# Patient Record
Sex: Male | Born: 1975 | Race: Black or African American | Hispanic: No | Marital: Married | Smoking: Former smoker
Health system: Southern US, Community
[De-identification: ages and names within clinical notes are randomized; demographics above are authoritative.]

## PROBLEM LIST (undated history)

## (undated) DIAGNOSIS — I10 Essential (primary) hypertension: Secondary | ICD-10-CM

## (undated) HISTORY — PX: OTHER SURGICAL HISTORY: SHX169

## (undated) HISTORY — DX: Essential (primary) hypertension: I10

---

## 2004-07-08 ENCOUNTER — Emergency Department (HOSPITAL_COMMUNITY): Admission: EM | Admit: 2004-07-08 | Discharge: 2004-07-08 | Payer: Self-pay | Admitting: Emergency Medicine

## 2006-05-29 ENCOUNTER — Emergency Department (HOSPITAL_COMMUNITY): Admission: EM | Admit: 2006-05-29 | Discharge: 2006-05-29 | Payer: Self-pay | Admitting: Emergency Medicine

## 2006-11-17 ENCOUNTER — Emergency Department (HOSPITAL_COMMUNITY): Admission: EM | Admit: 2006-11-17 | Discharge: 2006-11-17 | Payer: Self-pay | Admitting: Emergency Medicine

## 2008-10-14 ENCOUNTER — Emergency Department (HOSPITAL_COMMUNITY): Admission: EM | Admit: 2008-10-14 | Discharge: 2008-10-14 | Payer: Self-pay | Admitting: *Deleted

## 2010-05-28 ENCOUNTER — Emergency Department (HOSPITAL_COMMUNITY)
Admission: EM | Admit: 2010-05-28 | Discharge: 2010-05-28 | Payer: Self-pay | Source: Home / Self Care | Admitting: Emergency Medicine

## 2010-11-12 LAB — URINALYSIS, ROUTINE W REFLEX MICROSCOPIC
Nitrite: NEGATIVE
Specific Gravity, Urine: 1.031 — ABNORMAL HIGH (ref 1.005–1.030)
pH: 5.5 (ref 5.0–8.0)

## 2012-04-20 ENCOUNTER — Encounter (HOSPITAL_COMMUNITY): Payer: Self-pay | Admitting: Emergency Medicine

## 2012-04-20 ENCOUNTER — Emergency Department (HOSPITAL_COMMUNITY)
Admission: EM | Admit: 2012-04-20 | Discharge: 2012-04-20 | Discharge status: Against Medical Advice | Payer: Self-pay | Attending: Emergency Medicine | Admitting: Emergency Medicine

## 2012-04-20 DIAGNOSIS — R300 Dysuria: Secondary | ICD-10-CM | POA: Insufficient documentation

## 2012-04-20 DIAGNOSIS — R109 Unspecified abdominal pain: Secondary | ICD-10-CM | POA: Insufficient documentation

## 2012-04-20 LAB — CBC WITH DIFFERENTIAL/PLATELET
Basophils Absolute: 0 10*3/uL (ref 0.0–0.1)
Eosinophils Absolute: 0 10*3/uL (ref 0.0–0.7)
Lymphocytes Relative: 37 % (ref 12–46)
Lymphs Abs: 1.7 10*3/uL (ref 0.7–4.0)
Neutrophils Relative %: 56 % (ref 43–77)
Platelets: 205 10*3/uL (ref 150–400)
RBC: 5.01 MIL/uL (ref 4.22–5.81)
RDW: 12.8 % (ref 11.5–15.5)
WBC: 4.7 10*3/uL (ref 4.0–10.5)

## 2012-04-20 LAB — URINALYSIS, ROUTINE W REFLEX MICROSCOPIC
Nitrite: NEGATIVE
Specific Gravity, Urine: 1.024 (ref 1.005–1.030)
Urobilinogen, UA: 0.2 mg/dL (ref 0.0–1.0)

## 2012-04-20 LAB — POCT I-STAT, CHEM 8
BUN: 18 mg/dL (ref 6–23)
Creatinine, Ser: 1.4 mg/dL — ABNORMAL HIGH (ref 0.50–1.35)
Hemoglobin: 16.7 g/dL (ref 13.0–17.0)
Potassium: 4 mEq/L (ref 3.5–5.1)
Sodium: 141 mEq/L (ref 135–145)

## 2012-04-20 NOTE — ED Notes (Signed)
Reports 2 weeks ago, had dysuria, having L flank pain; pt reports urinary frequency; has tried drinking water and cranberry pills with no relief

## 2012-04-20 NOTE — ED Notes (Signed)
Pt reports he wants to go home d/t having to work and is unable to stay; advised pt of risks of leaving and benefits; pt wants to still leave

## 2012-04-22 ENCOUNTER — Encounter (HOSPITAL_COMMUNITY): Payer: Self-pay | Admitting: *Deleted

## 2012-04-22 ENCOUNTER — Emergency Department (HOSPITAL_COMMUNITY)
Admission: EM | Admit: 2012-04-22 | Discharge: 2012-04-22 | Disposition: A | Discharge status: Home / Self Care | Payer: Self-pay | Attending: Emergency Medicine | Admitting: Emergency Medicine

## 2012-04-22 DIAGNOSIS — R1012 Left upper quadrant pain: Secondary | ICD-10-CM | POA: Insufficient documentation

## 2012-04-22 DIAGNOSIS — R030 Elevated blood-pressure reading, without diagnosis of hypertension: Secondary | ICD-10-CM | POA: Insufficient documentation

## 2012-04-22 DIAGNOSIS — Z87891 Personal history of nicotine dependence: Secondary | ICD-10-CM | POA: Insufficient documentation

## 2012-04-22 DIAGNOSIS — R109 Unspecified abdominal pain: Secondary | ICD-10-CM

## 2012-04-22 LAB — POCT I-STAT, CHEM 8
Creatinine, Ser: 1.2 mg/dL (ref 0.50–1.35)
HCT: 46 % (ref 39.0–52.0)
Hemoglobin: 15.6 g/dL (ref 13.0–17.0)
Potassium: 3.8 mEq/L (ref 3.5–5.1)
Sodium: 141 mEq/L (ref 135–145)
TCO2: 25 mmol/L (ref 0–100)

## 2012-04-22 LAB — URINALYSIS, ROUTINE W REFLEX MICROSCOPIC
Hgb urine dipstick: NEGATIVE
Leukocytes, UA: NEGATIVE
Nitrite: NEGATIVE
Protein, ur: NEGATIVE mg/dL
Specific Gravity, Urine: 1.023 (ref 1.005–1.030)
Urobilinogen, UA: 0.2 mg/dL (ref 0.0–1.0)

## 2012-04-22 MED ORDER — IBUPROFEN 800 MG PO TABS: 800.0000 mg | ORAL_TABLET | Freq: Three times a day (TID) | ORAL | Status: AC

## 2012-04-22 NOTE — ED Notes (Signed)
Pt c/o increasing flank pain and dysuria x 2 months.  Denies n/v, SOB, CP

## 2012-04-22 NOTE — ED Provider Notes (Signed)
Medical screening examination/treatment/procedure(s) were performed by non-physician practitioner and as supervising physician I was immediately available for consultation/collaboration.   Loren Racer, MD 04/22/12 [phone]

## 2012-04-22 NOTE — ED Provider Notes (Signed)
History     CSN: [id number]  Arrival date & time 04/22/12  0403   First MD Initiated Contact with Patient 04/22/12 [phone]      Chief Complaint  Patient presents with  . Flank Pain    (Consider location/radiation/quality/duration/timing/severity/associated sxs/prior treatment) Patient is a 36 y.o. male presenting with flank pain. The history is provided by the patient.  Flank Pain This is a new problem. The current episode started more than 1 month ago. Associated symptoms include abdominal pain. Pertinent negatives include no chest pain, chills, coughing, fever, nausea or vomiting. Associated symptoms comments: Left flank pain x 2 months, initially associated with dysuria. Shortly after onset of symptoms, dysuria stopped but flank pain continued. One month ago, pain radiated into LUQ abdomen. No N, V, fever at any time. No hematuria. He denies penile discharge, testicular pain or scrotal swelling. He was concerned as he was lifting a lot of weights in the gym and taking supplements, but cannot report the contents of the supplements. He stopped both working out and taking supplements over one week ago without change. The pain now is worse with movement, better with rest. No pleuritic pain, cough or SOB. Marland Kitchen    History reviewed. No pertinent past medical history.  History reviewed. No pertinent past surgical history.  History reviewed. No pertinent family history.  History  Substance Use Topics  . Smoking status: Former Games developer  . Smokeless tobacco: Not on file   Comment: black and milds--quit 15 years ago  . Alcohol Use: Yes     occasion      Review of Systems  Constitutional: Negative for fever, chills and unexpected weight change.  Respiratory: Negative for cough and shortness of breath.   Cardiovascular: Negative for chest pain.  Gastrointestinal: Positive for abdominal pain. Negative for nausea, vomiting and diarrhea.  Genitourinary: Positive for dysuria and flank pain.  Negative for discharge, scrotal swelling and testicular pain.    Allergies  Review of patient's allergies indicates no known allergies.  Home Medications  No current outpatient prescriptions on file.  BP 144/91  Pulse 63  Temp 97.3 F (36.3 C) (Oral)  Resp 16  SpO2 98%  Physical Exam  Constitutional: He is oriented to person, place, and time. He appears well-developed and well-nourished.  Neck: Normal range of motion.  Pulmonary/Chest: Effort normal.  Abdominal: Soft. Bowel sounds are normal. There is no rebound and no guarding.       Mild tenderness to palpation LUQ and left side abdomen. No mass. Abdomen soft.   Genitourinary:       No CVA tenderness.  Musculoskeletal: Normal range of motion.  Neurological: He is alert and oriented to person, place, and time.  Skin: Skin is warm and dry. No rash noted.  Psychiatric: He has a normal mood and affect.    ED Course  Procedures (including critical care time)  Labs Reviewed  POCT I-STAT, CHEM 8 - Abnormal; Notable for the following:    Calcium, Ion 1.28 (*)     All other components within normal limits  URINALYSIS, ROUTINE W REFLEX MICROSCOPIC   Results for orders placed during the hospital encounter of 04/22/12  URINALYSIS, ROUTINE W REFLEX MICROSCOPIC      Component Value Range   Color, Urine YELLOW  YELLOW   APPearance CLEAR  CLEAR   Specific Gravity, Urine 1.023  1.005 - 1.030   pH 6.0  5.0 - 8.0   Glucose, UA NEGATIVE  NEGATIVE mg/dL   Hgb urine dipstick  NEGATIVE  NEGATIVE   Bilirubin Urine NEGATIVE  NEGATIVE   Ketones, ur NEGATIVE  NEGATIVE mg/dL   Protein, ur NEGATIVE  NEGATIVE mg/dL   Urobilinogen, UA 0.2  0.0 - 1.0 mg/dL   Nitrite NEGATIVE  NEGATIVE   Leukocytes, UA NEGATIVE  NEGATIVE  POCT I-STAT, CHEM 8      Component Value Range   Sodium 141  135 - 145 mEq/L   Potassium 3.8  3.5 - 5.1 mEq/L   Chloride 103  96 - 112 mEq/L   BUN 18  6 - 23 mg/dL   Creatinine, Ser 0.98  0.50 - 1.35 mg/dL   Glucose,  Bld 98  70 - 99 mg/dL   Calcium, Ion 1.19 (*) 1.12 - 1.23 mmol/L   TCO2 25  0 - 100 mmol/L   Hemoglobin 15.6  13.0 - 17.0 g/dL   HCT 14.7  82.9 - 56.2 %    No results found.   No diagnosis found. 1. Abdominal wall pain    MDM  Long duration of symptoms, heavy weight lifting and reproducible tenderness support muscular basis of pain. BUN, Cr. Evaluated given unknown supplements and mildly elevated blood pressure. Normal. Will discharge home, refer to PCP if persistent.        Rodena Medin, PA-C 04/22/12 [phone]

## 2012-09-28 ENCOUNTER — Emergency Department (HOSPITAL_COMMUNITY)
Admission: EM | Admit: 2012-09-28 | Discharge: 2012-09-29 | Disposition: A | Discharge status: Home / Self Care | Payer: Self-pay | Attending: Emergency Medicine | Admitting: Emergency Medicine

## 2012-09-28 DIAGNOSIS — K029 Dental caries, unspecified: Secondary | ICD-10-CM | POA: Insufficient documentation

## 2012-09-28 DIAGNOSIS — K047 Periapical abscess without sinus: Secondary | ICD-10-CM

## 2012-09-28 DIAGNOSIS — Z87891 Personal history of nicotine dependence: Secondary | ICD-10-CM | POA: Insufficient documentation

## 2012-09-28 NOTE — ED Notes (Signed)
Lt. Upper jaw is swollen, facial swelling. Third tooth from the back is painful r/t swelling.

## 2012-09-29 MED ORDER — OXYCODONE-ACETAMINOPHEN 5-325 MG PO TABS: 1.0000 | ORAL_TABLET | Freq: Four times a day (QID) | ORAL | Status: DC | PRN

## 2012-09-29 MED ORDER — CLINDAMYCIN HCL 300 MG PO CAPS: 300.0000 mg | ORAL_CAPSULE | Freq: Four times a day (QID) | ORAL | Status: DC

## 2012-09-29 MED ORDER — CLINDAMYCIN HCL 150 MG PO CAPS
300.0000 mg | ORAL_CAPSULE | Freq: Once | ORAL | Status: AC
  Administered 2012-09-29: 300 mg via ORAL
  Filled 2012-09-29: qty 2

## 2012-09-29 MED ORDER — OXYCODONE-ACETAMINOPHEN 5-325 MG PO TABS
2.0000 | ORAL_TABLET | Freq: Once | ORAL | Status: AC
Start: 2012-09-29 — End: 2012-09-29
  Administered 2012-09-29: 2 via ORAL
  Filled 2012-09-29: qty 2

## 2012-09-29 NOTE — ED Notes (Signed)
MD at bedside. 

## 2012-09-29 NOTE — ED Provider Notes (Signed)
History     CSN: [id number]  Arrival date & time 09/28/12  2319   First MD Initiated Contact with Patient 09/29/12 0007      Chief Complaint  Patient presents with  . Dental Pain   HPI  History provided by the patient. Patient is 37 year old male with no significant past medical history presents with complaints of left upper dental pain and facial swelling. Patient first began to have some slight swelling of the face yesterday and woke up this morning with significantly increased swelling of the face and pain. Patient has had a long history of a bad tooth in this area which occasionally causes some pain in and irritation. This was worsened over the past several days. He denies any other associated symptoms. Denies any fever, chills or sweats. Denies any bleeding or drainage. He denies any other aggravating or alleviating factors.     No past medical history on file.  No past surgical history on file.  No family history on file.  History  Substance Use Topics  . Smoking status: Former Games developer  . Smokeless tobacco: Not on file     Comment: black and milds--quit 15 years ago  . Alcohol Use: Yes     Comment: occasion      Review of Systems  Constitutional: Negative for fever and chills.  HENT: Positive for dental problem. Negative for sore throat and trouble swallowing.   Gastrointestinal: Negative for nausea and vomiting.  All other systems reviewed and are negative.    Allergies  Review of patient's allergies indicates no known allergies.  Home Medications   Current Outpatient Rx  Name  Route  Sig  Dispense  Refill  . IBUPROFEN 200 MG PO TABS   Oral   Take 200 mg by mouth every 6 (six) hours as needed. For pain           BP 174/90  Pulse 81  Temp 97.9 F (36.6 C) (Oral)  Resp 16  SpO2 100%  Physical Exam  Nursing note and vitals reviewed. Constitutional: He is oriented to person, place, and time. He appears well-developed and well-nourished.  HENT:    Head: Normocephalic.  Mouth/Throat:         Swelling and loss of the left nasolabial fold.  Skin otherwise normal without erythema or induration.  There is erosion of the left upper 2nd premolar to the gum line. There is swelling with fluctuance to the lateral and adjacent gum with TTP. Significant dental cary to the left upper first molar.    Neck: Normal range of motion. Neck supple.  Cardiovascular: Normal rate and regular rhythm.   Pulmonary/Chest: Effort normal and breath sounds normal.  Lymphadenopathy:    He has no cervical adenopathy.  Neurological: He is alert and oriented to person, place, and time.  Skin: Skin is warm.  Psychiatric: He has a normal mood and affect.    ED Course  Procedures   INCISION AND DRAINAGE Performed by: Angus Seller Consent: Verbal consent obtained. Risks and benefits: risks, benefits and alternatives were discussed Type: dental abscess  Body area: left upper 2nd premolar  Anesthesia: local infiltration  Incision was made with a scalpel.  Local anesthetic: Bupivacaine 0.5% with epinephrine  Anesthetic total: 1.8 ml  Complexity: complex  Drainage: purulent  Drainage amount: moderate  Packing material: none  Patient tolerance: Patient tolerated the procedure well with no immediate complications.       1. Dental abscess       MDM  12:00AM Pt seen and evaluated.  Pt does not appear in any acute distress.        Angus Seller, [place] 09/29/12 [phone]

## 2012-09-29 NOTE — ED Provider Notes (Signed)
Medical screening examination/treatment/procedure(s) were performed by non-physician practitioner and as supervising physician I was immediately available for consultation/collaboration.    Vida Roller, MD 09/29/12 [phone]

## 2014-05-02 ENCOUNTER — Ambulatory Visit
Admission: RE | Admit: 2014-05-02 | Discharge: 2014-05-02 | Disposition: A | Discharge status: Home / Self Care | Payer: 59 | Source: Ambulatory Visit | Attending: Family Medicine | Admitting: Family Medicine

## 2014-05-02 ENCOUNTER — Other Ambulatory Visit: Payer: Self-pay | Admitting: Family Medicine

## 2014-05-02 DIAGNOSIS — R300 Dysuria: Secondary | ICD-10-CM

## 2016-03-24 ENCOUNTER — Other Ambulatory Visit: Payer: Self-pay | Admitting: Family Medicine

## 2016-03-24 ENCOUNTER — Ambulatory Visit
Admission: RE | Admit: 2016-03-24 | Discharge: 2016-03-24 | Disposition: A | Discharge status: Home / Self Care | Payer: BLUE CROSS/BLUE SHIELD | Source: Ambulatory Visit | Attending: Family Medicine | Admitting: Family Medicine

## 2016-03-24 DIAGNOSIS — M25572 Pain in left ankle and joints of left foot: Secondary | ICD-10-CM

## 2016-03-24 DIAGNOSIS — M25562 Pain in left knee: Secondary | ICD-10-CM

## 2016-04-19 ENCOUNTER — Encounter: Payer: Self-pay | Admitting: Surgery

## 2016-04-21 ENCOUNTER — Encounter: Payer: Self-pay | Admitting: Surgery

## 2016-04-21 ENCOUNTER — Other Ambulatory Visit: Payer: Self-pay | Admitting: *Deleted

## 2016-04-21 ENCOUNTER — Ambulatory Visit (HOSPITAL_COMMUNITY)
Admission: RE | Admit: 2016-04-21 | Discharge: 2016-04-21 | Disposition: A | Discharge status: Home / Self Care | Payer: Worker's Compensation | Source: Ambulatory Visit | Attending: Surgery | Admitting: Surgery

## 2016-04-21 ENCOUNTER — Ambulatory Visit (INDEPENDENT_AMBULATORY_CARE_PROVIDER_SITE_OTHER): Payer: BLUE CROSS/BLUE SHIELD | Admitting: Surgery

## 2016-04-21 VITALS — BP 145/96 | HR 85 | Temp 97.3°F | Resp 18 | Ht 78.0 in | Wt 276.0 lb

## 2016-04-21 DIAGNOSIS — I10 Essential (primary) hypertension: Secondary | ICD-10-CM

## 2016-04-21 DIAGNOSIS — T794XXA Traumatic shock, initial encounter: Secondary | ICD-10-CM | POA: Diagnosis not present

## 2016-04-21 DIAGNOSIS — R0989 Other specified symptoms and signs involving the circulatory and respiratory systems: Secondary | ICD-10-CM | POA: Diagnosis present

## 2016-04-21 DIAGNOSIS — I739 Peripheral vascular disease, unspecified: Secondary | ICD-10-CM | POA: Insufficient documentation

## 2016-04-21 LAB — VAS US LOWER EXTREMITY ARTERIAL DUPLEX
LATIBDISTSYS: 56 cm/s
LPTIBDISTSYS: 85 cm/s
LSFDPSV: -71 cm/s
LSFPPSV: 71 cm/s
Left popliteal dist sys PSV: -72 cm/s
Left popliteal prox sys PSV: 52 cm/s
Left super femoral mid sys PSV: -76 cm/s
Right super femoral prox sys PSV: 80 cm/s
left post tibial mid sys: 99 cm/s

## 2016-04-21 NOTE — Progress Notes (Signed)
Vitals:   04/21/16 1046  BP: (!) 152/95  Pulse: 85  Resp: 18  Temp: 97.3 F (36.3 C)  SpO2: 99%  Weight: 276 lb (125.2 kg)  Height: 6\' 6"  (1.981 m)

## 2016-04-21 NOTE — Progress Notes (Signed)
[facility] [place]  Patient name: Donald Stafford L Orchard MRN: [id number] DOB: [birth year] Sex: male  REFERRING PHYSICIAN: Dr. Berline Choughigby  REASON FOR CONSULT: ? Arterial trauma  HPI: Donald Stafford L Knee is a 40 y.o. male, who is who is referred today for evaluation of arterial injury to his left leg.  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  He describes a slow compressive injury causing him significant pain and swelling along the inside of his left knee and ankle.  This was apparent at the time of injury but did not cause him to severe discomfort until the following day.  He has had approximately 3 separate episodes of having to have the fluid in his knee aspirated.  He complains of some numbness around the inside of his left ankle.  While at Dr. Janeece Riggersigby's office, the patient had a musculoskeletal ultrasound performed during the study there was concern for an AV fistula as well as possible aneurysmal dilatation.  Therefore he was sent today for formal vascular evaluation.  The patient's only pertinent medical history is hypertension.  Past Medical History:  Diagnosis Date  . Hypertension     Family History  Problem Relation Age of Onset  . Hypertension Mother     SOCIAL HISTORY: Social History   Social History  . Marital status: Married    Spouse name: N/A  . Number of children: N/A  . Years of education: N/A   Occupational History  . Not on file.   Social History Main Topics  . Smoking status: Former Smoker    Types: Cigars    Quit date: 10/23/2015  . Smokeless tobacco: Never Used     Comment: black and milds--quit 15 years ago  . Alcohol use Yes     Comment: occasion  . Drug use: No  . Sexual activity: Not on file   Other Topics Concern  . Not on file   Social History Narrative  . No narrative on file    No Known Allergies  Current Outpatient Prescriptions  Medication Sig  Dispense Refill  . hydrochlorothiazide (HYDRODIURIL) 12.5 MG tablet TK 1 T PO QD  12  . oxyCODONE-acetaminophen (PERCOCET/ROXICET) 5-325 MG per tablet Take 1-2 tablets by mouth every 6 (six) hours as needed for pain. 20 tablet 0  . clindamycin (CLEOCIN) 300 MG capsule Take 1 capsule (300 mg total) by mouth 4 (four) times daily. X 7 days (Patient not taking: Reported on 04/21/2016) 28 capsule 0  . ibuprofen (ADVIL,MOTRIN) 200 MG tablet Take 200 mg by mouth every 6 (six) hours as needed. For pain     No current facility-administered medications for this visit.     REVIEW OF SYSTEMS:  [X]  denotes positive finding, [ ]  denotes negative finding Cardiac  Comments:  Chest pain or chest pressure:    Shortness of breath upon exertion:    Short of breath when lying flat:    Irregular heart rhythm:        Vascular    Pain in calf, thigh, or hip brought on by ambulation:    Pain in feet at night that wakes you up from your sleep:     Blood clot in your veins:    Leg swelling:         Pulmonary    Oxygen at home:    Productive cough:     Wheezing:         Neurologic  Sudden weakness in arms or legs:  Numbness around left ankle   Sudden numbness in arms or legs:     Sudden onset of difficulty speaking or slurred speech:    Temporary loss of vision in one eye:     Problems with dizziness:         Gastrointestinal    Blood in stool:     Vomited blood:         Genitourinary    Burning when urinating:     Blood in urine:        Psychiatric    Major depression:         Hematologic    Bleeding problems:    Problems with blood clotting too easily:        Skin    Rashes or ulcers:        Constitutional    Fever or chills:      PHYSICAL EXAM: Vitals:   04/21/16 1046 04/21/16 1050  BP: (!) 152/95 (!) 145/96  Pulse: 85 85  Resp: 18   Temp: 97.3 F (36.3 C)   SpO2: 99%   Weight: 276 lb (125.2 kg)   Height: 6\' 6"  (1.981 m)     GENERAL: The patient is a well-nourished male,  in no acute distress. The vital signs are documented above. CARDIAC: There is a regular rate and rhythm.  VASCULAR: Palpable dorsalis pedis pulse.  Brisk Doppler signals within the posterior tibial artery at the ankle  PULMONARY: There is good air exchange bilaterally without wheezing or rales. MUSCULOSKELETAL:Prominent appearance of the medial side of the left knee  NEUROLOGIC: No focal weakness or paresthesias are detected. SKIN: There are no ulcers or rashes noted. PSYCHIATRIC: The patient has a normal affect.  DATA:  I had the left leg ultrasounded today given the findings on the outside ultrasound.  No AV fistula was identified.  No aneurysm was identified.  The arterial waveforms all appeared normal  ASSESSMENT AND PLAN: Based on the ultrasound findings today, I do not think that the patient has a arterial or venous pathology.  I have discussed this with the patient.  It is unclear to me what was visualized on the musculoskeletal ultrasound.  No further vascular workup is recommended at this time.  Durene CalWells Doneen Ollinger, MD [facility] Tel [phone] Pager [phone]

## 2016-05-25 ENCOUNTER — Other Ambulatory Visit: Payer: Self-pay | Admitting: Family Medicine

## 2016-05-25 DIAGNOSIS — R079 Chest pain, unspecified: Secondary | ICD-10-CM

## 2016-05-27 ENCOUNTER — Other Ambulatory Visit: Payer: Self-pay

## 2016-05-31 ENCOUNTER — Other Ambulatory Visit: Payer: Self-pay | Admitting: Family Medicine

## 2016-05-31 ENCOUNTER — Ambulatory Visit
Admission: RE | Admit: 2016-05-31 | Discharge: 2016-05-31 | Disposition: A | Discharge status: Home / Self Care | Payer: BLUE CROSS/BLUE SHIELD | Source: Ambulatory Visit | Attending: Family Medicine | Admitting: Family Medicine

## 2016-05-31 DIAGNOSIS — R0789 Other chest pain: Secondary | ICD-10-CM

## 2016-06-17 ENCOUNTER — Other Ambulatory Visit: Payer: Self-pay | Admitting: Family Medicine

## 2016-06-17 DIAGNOSIS — R109 Unspecified abdominal pain: Secondary | ICD-10-CM

## 2016-06-18 ENCOUNTER — Ambulatory Visit
Admission: RE | Admit: 2016-06-18 | Discharge: 2016-06-18 | Disposition: A | Discharge status: Home / Self Care | Payer: BLUE CROSS/BLUE SHIELD | Source: Ambulatory Visit | Attending: Family Medicine | Admitting: Family Medicine

## 2016-06-18 DIAGNOSIS — R109 Unspecified abdominal pain: Secondary | ICD-10-CM

## 2016-06-18 MED ORDER — IOPAMIDOL (ISOVUE-300) INJECTION 61%
125.0000 mL | Freq: Once | INTRAVENOUS | Status: AC | PRN
Start: 2016-06-18 — End: 2016-06-18
  Administered 2016-06-18: 125 mL via INTRAVENOUS

## 2016-07-15 ENCOUNTER — Ambulatory Visit (INDEPENDENT_AMBULATORY_CARE_PROVIDER_SITE_OTHER): Payer: Worker's Compensation | Admitting: Sports Medicine

## 2016-07-15 ENCOUNTER — Inpatient Hospital Stay (INDEPENDENT_AMBULATORY_CARE_PROVIDER_SITE_OTHER): Payer: Worker's Compensation

## 2016-07-15 ENCOUNTER — Encounter (INDEPENDENT_AMBULATORY_CARE_PROVIDER_SITE_OTHER): Payer: Self-pay | Admitting: Sports Medicine

## 2016-07-15 VITALS — BP 134/92 | HR 83 | Ht 78.0 in | Wt 280.0 lb

## 2016-07-15 DIAGNOSIS — M722 Plantar fascial fibromatosis: Secondary | ICD-10-CM | POA: Diagnosis not present

## 2016-07-15 DIAGNOSIS — M79672 Pain in left foot: Secondary | ICD-10-CM | POA: Diagnosis not present

## 2016-07-15 DIAGNOSIS — S8002XD Contusion of left knee, subsequent encounter: Secondary | ICD-10-CM | POA: Diagnosis not present

## 2016-07-15 DIAGNOSIS — S9002XD Contusion of left ankle, subsequent encounter: Secondary | ICD-10-CM | POA: Diagnosis not present

## 2016-07-15 MED ORDER — DICLOFENAC SODIUM 1 % TD GEL: TRANSDERMAL | 2 refills | Status: DC

## 2016-07-15 NOTE — Progress Notes (Signed)
Donald Stafford - 40 y.o. male MRN [id number]  Date of birth: 07/23/1976  Office Visit Note: Visit Date: 07/15/2016 PCP: [name], MD Referred by: [name], L.[name], MD  Subjective: Chief Complaint  Patient presents with  . Left Knee - Follow-up  . Left Ankle - Follow-up  . Follow-up    Patient states going good. States left foot and left knee has been hurting for about a week.  Had some swellling in left foot when getting off from work in the mornings.     HPI: Patient presents for reevaluation of left ankle & knee pain. He overall has been doing well & has been able to resume activities at work without significant issues. He has noticed some worsening fluctuance,  discomfort & snapping along the medial aspect of his knee. He localizes to the area of the prior hematoma. Additionally his left ankle has continued to have discomfort along the medial aspect & he does report severe pain upon first awakening along the plantar aspect of his heel. He is no longer taking any medications but is continuing to use the compression sleeves especially while work. He is not been performing any specific therapeutic exercises. Prior to 1 week ago he was feeling well & reports only minimal symptoms however with the most recent weather change she has had a flareup of the discomfort. ROS: Otherwise per HPI.   Clinical History: No specialty comments available.  He reports that he quit smoking about 8 months ago. His smoking use included Cigars. He has never used smokeless tobacco.  No results for input(s): HGBA1C, LABURIC in the last 8760 hours.  Assessment & Plan: Visit Diagnoses:  1. Pain in left foot   2. Plantar fasciitis of left foot   3. Contusion of left ankle, subsequent encounter   4. Contusion of left knee, subsequent encounter    Plan: Given findings today on [facility] ultrasound physical exam on like for him to begin on Alfredson protocol for his plantar fasciitis as well as obtain cushioned  orthotics/insoles for her shoes. Additionally for the persistent discomfort along the medial ankle & knee which seems to reflect a resolving hematoma he is to begin using topical Voltaren gel. He should continue with compression.  Follow-up: Return in about 5 weeks (around 08/19/2016) for repeat clinical exam. Will hopefully be to release him at that time as long as he continues to do well.  Meds:  Meds ordered this encounter  Medications  . diclofenac sodium (VOLTAREN) 1 % GEL    Sig: Apply topically to medial knee and medial ankle qid    Dispense:  100 g    Refill:  2   Procedures: No notes on file   Objective:  VS:  HT:6\' 6"  (198.1 cm)   WT:280 lb (127 kg)  BMI:32.4    BP:(!) 134/92  HR:83bpm  TEMP: ( )  RESP:  Physical Exam: Adult male. No acute distress. Alert & appropriate.  Left knee: Small area of fluctuance along the medial knee localizes superior to the joint line within the soft tissue. This is nonpainful but does have a small amount of fluctuance & this reproduces symptoms he reports as being bothersome. Negative McMurray's. Hehas full range of motion. Anterior/ posterior drawer intact. Stable to varus & valgus strain. No medial or lateral joint line pain. No knee effusion. Left ankle: Some persistent pain along the distal posterior tibialis tendon. He has good strength. Dorsiflexion to 90 passively with an extended knee. He has marked pain  along the insertion of the plantar fascia. Slightly positive Tinel's just distal to the medial malleolus.  Imaging: [employer] Extrem Low Left [employer]  Result Date: 07/16/2016 Limited [facility] ultrasound of left ankle: Images obtained today and interpreted by myself. Findings:  Posterior tibialis tendon visualized in its entirety with overall intact tendon fibers with moderate thickening of the tendon sheath with neovascular changes superficial to this within the soft tissue structures.  Plantar fascia was measured today at approximately 0.6 cm. Impression:  Improving posterior tibialis tenosynovitis in the setting of underlying ankle contusion with moderate plantar fasciitis.    Past Medical/Family/Surgical/Social History: Medications & Allergies reviewed per EMR There are no active problems to display for this patient.  Past Medical History:  Diagnosis Date  . Hypertension    Family History  Problem Relation Age of Onset  . Hypertension Mother    Past Surgical History:  Procedure Laterality Date  . arm surgery Right    Social History   Occupational History  . Not on file.   Social History Main Topics  . Smoking status: Former Smoker    Types: Cigars    Quit date: 10/23/2015  . Smokeless tobacco: Never Used     Comment: black and milds--quit 15 years ago  . Alcohol use Yes     Comment: occasion  . Drug use: No  . Sexual activity: Not on file

## 2016-08-16 ENCOUNTER — Ambulatory Visit (INDEPENDENT_AMBULATORY_CARE_PROVIDER_SITE_OTHER): Payer: Worker's Compensation | Admitting: Sports Medicine

## 2016-08-16 ENCOUNTER — Encounter (INDEPENDENT_AMBULATORY_CARE_PROVIDER_SITE_OTHER): Payer: Self-pay

## 2016-08-16 ENCOUNTER — Encounter (INDEPENDENT_AMBULATORY_CARE_PROVIDER_SITE_OTHER): Payer: Self-pay | Admitting: Sports Medicine

## 2016-08-16 VITALS — BP 150/104 | HR 83 | Ht 78.0 in | Wt 285.0 lb

## 2016-08-16 DIAGNOSIS — M79672 Pain in left foot: Secondary | ICD-10-CM

## 2016-08-16 DIAGNOSIS — S9002XD Contusion of left ankle, subsequent encounter: Secondary | ICD-10-CM | POA: Diagnosis not present

## 2016-08-16 DIAGNOSIS — M722 Plantar fascial fibromatosis: Secondary | ICD-10-CM

## 2016-08-16 DIAGNOSIS — S8002XD Contusion of left knee, subsequent encounter: Secondary | ICD-10-CM

## 2016-08-16 NOTE — Patient Instructions (Signed)
It was great seen today.  It has been a pleasure to take care of you.  I will have you follow-up in 3 months with Dr. [name] since I'm changing practices.  Continue performing the Alfredson exercises I provided at last visit on a 2-3 time per day basis.  Is okay to continue with the diclofenac gel 2-4 times per day as needed.  Ice your heel as needed as well

## 2016-08-16 NOTE — Progress Notes (Signed)
Donald Stafford - 40 y.o. male MRN [id number]  Date of birth: [birth year]  Office Visit Note: Visit Date: 08/16/2016 PCP: Donald Carneyean Mitchell, MD Referred by: Donald Stafford, L.Donald Saucerean, MD  Subjective: Chief Complaint  Patient presents with  . Left Foot - Follow-up  . Follow-up    Patient states left foot is getting better, but hurts when getting up in the mornings.  Hurts after being on left foot for to long.   HPI: Patient reports overall continuing to improve and having essentially no medial ankle or medial knee pain.  He is continuing to have pain along the plantar aspect of his heel especially upon first awakening, towards the end of the day and after prolonged rest.  He has been working on [employer] exercises and continues to wear the ankle bodies helix.  He has used Voltaren gel 1-2 times daily with moderate improvement over the medial ankle.  He does continue to have some swelling over the medial ankle but has not noted any significant pain with inversion and eversion motions of the ankle. ROS: He is not having to take any medications at this time other topical Voltaren.  SOLE insoles were not approved through worker's compensation.. Otherwise per HPI.   Clinical History: No specialty comments available.  He reports that he quit smoking about 9 months ago. His smoking use included Cigars. He has never used smokeless tobacco.  No results for input(s): HGBA1C, LABURIC in the last 8760 hours.  Assessment & Plan: Visit Diagnoses:    ICD-9-CM ICD-10-CM   1. Pain in left foot 729.5 M79.672   2. Contusion of left ankle, subsequent encounter V58.89 S90.02XD   3. Contusion of left knee, subsequent encounter V58.89 S80.02XD    924.11    4. Plantar fasciitis of left foot 728.71 M72.2     Plan: Overall the contusion related pain in the knee and ankle have shown remarkable improvement and are almost totally resolved.This was stemming from a crush injury on July 14 and he became entrapped between a  palate jack.  Developed a large hematoma along the medial aspect of the knee that underwent serial aspirations by his PCP and subsequently developed swelling in tenosynovitis of the medial ankle.  He was having some posterior tibialis tenosynovitis that I believe has also improved although he does have some residual swelling.  He should continue to show good improvement with this and I would like for him to continue wearing a Body Helix compression sleeve and continue with the Alfredson exercises.  I have provided a waffle heel cup to be added to shoes for additional heel support.  The plantar fasciitis-type pain that he is having his directly related to the prior ankle and knee issues from the injury sustained earlier in the year.  This is more of a compensatory pattern following the immobilization and favoring of his leg. We discussed that he will likely continue to have some swelling but should continue to show improvement in that the plantar fasciitis will also continue to resolve.  I suspect this should do well without any further intervention but will ask that he follow-up with my partner for final evaluation in 3 months.  Follow-up: Return in about 3 months (around 11/14/2016) for With Dr. [name] for hopeful rate and release.  Meds: No orders of the defined types were placed in this encounter.  Procedures: No notes on file   Objective:  VS:  HT:6\' 6"  (198.1 cm)   WT:285 lb (129.3 kg)  BMI:33  BP:(!) 150/104  HR:83bpm  TEMP: ( )  RESP:  Physical Exam:  Adult male. Alert and appropriate.  In no acute distress.  Lower extremities are overall well aligned with no significant deformity. No significant swelling.  Distal pulses 2+/4. No significant bruising/ecchymosis or erythema the skin Left ankle and knee: Knee is only a small amount of residual firmness along the medial distal thigh.  Ligamentously stable.  His left ankle continues to be mildly swollen along the medial aspect.  He has minimal  tenderness palpation along the posterior aspect of the medial malleolus posterior tibialis tendons and insertion on the navicular.  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  Past Medical/Family/Surgical/Social History: Medications & Allergies reviewed per EMR There are no active problems to display for this patient.  Past Medical History:  Diagnosis Date  . Hypertension    Family History  Problem Relation Age of Onset  . Hypertension Mother    Past Surgical History:  Procedure Laterality Date  . arm surgery Right    Social History   Occupational History  . Not on file.   Social History Main Topics  . Smoking status: Former Smoker    Types: Cigars    Quit date: 10/23/2015  . Smokeless tobacco: Never Used     Comment: black and milds--quit 15 years ago  . Alcohol use Yes     Comment: occasion  . Drug use: No  . Sexual activity: Not on file

## 2016-11-12 NOTE — Progress Notes (Deleted)
Patient is a 41 y.o male who presents today for 3 month left foot evaluation. Previous patient of Dr. Berline Choughigby. He is status post workman's comp injury from July 2017 where he was entrapped against a palate jack.

## 2016-11-15 ENCOUNTER — Ambulatory Visit (INDEPENDENT_AMBULATORY_CARE_PROVIDER_SITE_OTHER): Payer: Worker's Compensation | Admitting: Orthopedic Surgery

## 2016-11-22 ENCOUNTER — Ambulatory Visit (INDEPENDENT_AMBULATORY_CARE_PROVIDER_SITE_OTHER): Payer: Worker's Compensation | Admitting: Orthopedic Surgery

## 2016-11-22 ENCOUNTER — Encounter (INDEPENDENT_AMBULATORY_CARE_PROVIDER_SITE_OTHER): Payer: Self-pay | Admitting: Orthopedic Surgery

## 2016-11-22 DIAGNOSIS — S8002XA Contusion of left knee, initial encounter: Secondary | ICD-10-CM

## 2016-11-22 DIAGNOSIS — M722 Plantar fascial fibromatosis: Secondary | ICD-10-CM | POA: Diagnosis not present

## 2016-11-22 NOTE — Progress Notes (Signed)
   Office Visit Note   Patient: Donald Stafford           Date of Birth: July 15, 1976           MRN: [id number] Visit Date: 11/22/2016              Requested by: L.[name], MD [address] [place], [place] [address] PCP: [name], MD  Chief Complaint  Patient presents with  . Left Ankle - Follow-up    HPI: Patient is a 41 year old gentleman who is seen for initial evaluation status post crush injury on 03/12/2016. Patient was caught between a Neurosurgeonpallet jack. Patient developed a hematoma around the left knee this was aspirated and he states his knee is completely asymptomatic. Patient also had a contusion to the left foot and ankle and states he does have some start up pain over the origin the plantar fashion the morning. Patient is back to regular work without restrictions. Patient is seen with his workmen's comp nurse for final evaluation.  Assessment & Plan: Visit Diagnoses:  1. Plantar fascial fibromatosis   2. Contusion of left knee, initial encounter     Plan: Patient was given instructions to demonstrate heel cord stretching to unload the tension on the plantar fascia. Patient is released without restrictions he is at maximal medical improvement and review of the [place]  guidelines for permanent partial impairment his permanent partial impairment would be 0% for the left knee and left foot and ankle.  Follow-Up Instructions: Return if symptoms worsen or fail to improve.   Ortho Exam  Patient is alert, oriented, no adenopathy, well-dressed, normal affect, normal respiratory effort. Patient has a normal gait. Examination the left knee there is no effusion he is asymptomatic. Examination the left foot and ankle he has good pulses he has a cavovarus foot with heel cord contracture he has some tenderness to palpation over the medial calcaneal branch of the left lateral plantar nerve. Patient's plantar fascia is nontender to palpation at this time he has no  pain with lateral compression of the calcaneus. He has a good dorsalis pedis pulse he has dorsiflexion to neutral with his knee extended.  Imaging: No results found.  Labs: No results found for: HGBA1C, ESRSEDRATE, CRP, LABURIC, REPTSTATUS, GRAMSTAIN, CULT, LABORGA  Orders:  No orders of the defined types were placed in this encounter.  No orders of the defined types were placed in this encounter.    Procedures: No procedures performed  Clinical Data: No additional findings.  ROS: Review of Systems  Objective: Vital Signs: There were no vitals taken for this visit.  Specialty Comments:  No specialty comments available.  PMFS History: There are no active problems to display for this patient.  Past Medical History:  Diagnosis Date  . Hypertension     Family History  Problem Relation Age of Onset  . Hypertension Mother     Past Surgical History:  Procedure Laterality Date  . arm surgery Right    Social History   Occupational History  . Not on file.   Social History Main Topics  . Smoking status: Former Smoker    Types: Cigars    Quit date: 10/23/2015  . Smokeless tobacco: Never Used     Comment: black and milds--quit 15 years ago  . Alcohol use Yes     Comment: occasion  . Drug use: No  . Sexual activity: Not on file

## 2017-03-28 DIAGNOSIS — I10 Essential (primary) hypertension: Secondary | ICD-10-CM | POA: Diagnosis not present

## 2017-03-28 DIAGNOSIS — E7800 Pure hypercholesterolemia, unspecified: Secondary | ICD-10-CM | POA: Diagnosis not present

## 2017-04-11 DIAGNOSIS — Z209 Contact with and (suspected) exposure to unspecified communicable disease: Secondary | ICD-10-CM | POA: Diagnosis not present

## 2017-04-11 DIAGNOSIS — R300 Dysuria: Secondary | ICD-10-CM | POA: Diagnosis not present

## 2017-04-11 DIAGNOSIS — Z114 Encounter for screening for human immunodeficiency virus [HIV]: Secondary | ICD-10-CM | POA: Diagnosis not present

## 2017-05-09 DIAGNOSIS — M7712 Lateral epicondylitis, left elbow: Secondary | ICD-10-CM | POA: Diagnosis not present

## 2017-07-23 ENCOUNTER — Other Ambulatory Visit: Payer: Self-pay

## 2017-07-23 ENCOUNTER — Ambulatory Visit (HOSPITAL_COMMUNITY)
Admission: EM | Admit: 2017-07-23 | Discharge: 2017-07-23 | Disposition: A | Discharge status: Home / Self Care | Payer: 59 | Attending: Internal Medicine | Admitting: Internal Medicine

## 2017-07-23 ENCOUNTER — Encounter (HOSPITAL_COMMUNITY): Payer: Self-pay | Admitting: Emergency Medicine

## 2017-07-23 DIAGNOSIS — M542 Cervicalgia: Secondary | ICD-10-CM | POA: Diagnosis not present

## 2017-07-23 DIAGNOSIS — K0889 Other specified disorders of teeth and supporting structures: Secondary | ICD-10-CM | POA: Diagnosis not present

## 2017-07-23 MED ORDER — KETOROLAC TROMETHAMINE 60 MG/2ML IM SOLN
60.0000 mg | Freq: Once | INTRAMUSCULAR | Status: AC
  Administered 2017-07-23: 60 mg via INTRAMUSCULAR

## 2017-07-23 MED ORDER — MELOXICAM 15 MG PO TABS: 15.0000 mg | ORAL_TABLET | Freq: Every day | ORAL | 0 refills | Status: DC

## 2017-07-23 MED ORDER — PENICILLIN V POTASSIUM 500 MG PO TABS: 500.0000 mg | ORAL_TABLET | Freq: Four times a day (QID) | ORAL | 0 refills | Status: AC

## 2017-07-23 MED ORDER — KETOROLAC TROMETHAMINE 60 MG/2ML IM SOLN
INTRAMUSCULAR | Status: AC
  Filled 2017-07-23: qty 2

## 2017-07-23 NOTE — ED Triage Notes (Signed)
Pt c/o R lower dental pain, which caused neck pain and swelling. Pt in NAD.

## 2017-07-23 NOTE — ED Provider Notes (Signed)
[facility]    CSN: [id number] Arrival date & time: 07/23/17  1223     History   Chief Complaint Chief Complaint  Patient presents with  . Dental Pain  . Neck Pain    HPI Donald Stafford is a 41 y.o. male.   41 year old male comes in for 2 day history of lower dental pain with neck pain and swelling. Denies fevers, chills, night sweats. Full ROM of neck. Denies trobule swallowing/breathing. Took ibuprofen 800mg  yesterday with little relief. States neck pain is worse than the tooth pain now. States he thinks it is his right lower wisdom tooth that is bothering him.  BP 182/107. Patient did not take BP meds this morning.       Past Medical History:  Diagnosis Date  . Hypertension     There are no active problems to display for this patient.   Past Surgical History:  Procedure Laterality Date  . arm surgery Right        Home Medications    Prior to Admission medications   Medication Sig Start Date End Date Taking? Authorizing Provider  hydrochlorothiazide (HYDRODIURIL) 12.5 MG tablet TK 1 T PO QD 03/24/16  Yes [provider]  ibuprofen (ADVIL,MOTRIN) 200 MG tablet Take 200 mg by mouth every 6 (six) hours as needed. For pain   Yes [provider]  losartan (COZAAR) 50 MG tablet TK 1 T PO QD 07/11/16  Yes [provider]  meloxicam (MOBIC) 15 MG tablet Take 1 tablet (15 mg total) by mouth daily. 07/23/17   Cathie HoopsYu, Izabellah Dadisman V, PA-C  penicillin v potassium (VEETID) 500 MG tablet Take 1 tablet (500 mg total) by mouth 4 (four) times daily for 7 days. 07/23/17 07/30/17  Belinda FisherYu, Yeilin Zweber V, PA-C    Family History Family History  Problem Relation Age of Onset  . Hypertension Mother     Social History Social History   Tobacco Use  . Smoking status: Former Smoker    Types: Cigars    Last attempt to quit: 10/23/2015    Years since quitting: 1.7  . Smokeless tobacco: Never Used  . Tobacco comment: black and milds--quit 15 years ago    Substance Use Topics  . Alcohol use: Yes    Comment: occasion  . Drug use: No     Allergies   Shellfish allergy   Review of Systems Review of Systems  Reason unable to perform ROS: See HPI as above.     Physical Exam Triage Vital Signs ED Triage Vitals  Enc Vitals Group     BP 07/23/17 1336 (!) 182/107     Pulse Rate 07/23/17 1336 79     Resp 07/23/17 1336 18     Temp 07/23/17 1336 98.6 F (37 C)     Temp Source 07/23/17 1336 Oral     SpO2 07/23/17 1336 100 %     Weight --      Height --      Head Circumference --      Peak Flow --      Pain Score 07/23/17 1338 7     Pain Loc --      Pain Edu? --      Excl. in GC? --    No data found.  Updated Vital Signs BP (!) 182/107 (BP Location: Left Arm) Comment: Reported BP to NP Velia Meyerathy Rossi. patient stated that he is on BP medication, he was upset a family member had passed away  earlier this morning  Pulse 79   Temp 98.6 F (37 C) (Oral)   Resp 18   SpO2 100%   Physical Exam  Constitutional: He is oriented to person, place, and time. He appears well-developed and well-nourished. No distress.  HENT:  Head: Normocephalic and atraumatic.  Mouth/Throat: Oropharynx is clear and moist and mucous membranes are normal.  Crowns/fillings of right lower molars. No obvious new caries. No tenderness on palpation of the gums. Floor of mouth soft and nontender.   Eyes: Conjunctivae are normal. Pupils are equal, round, and reactive to light.  Neck: Normal range of motion. Neck supple. No spinous process tenderness and no muscular tenderness present. Normal range of motion present.  Pulmonary/Chest: Effort normal. No respiratory distress.  Lymphadenopathy:    He has cervical adenopathy.  Neurological: He is alert and oriented to person, place, and time.     UC Treatments / Results  Labs (all labs ordered are listed, but only abnormal results are displayed) Labs Reviewed - No data to display  EKG  EKG  Interpretation None       Radiology No results found.  Procedures Procedures (including critical care time)  Medications Ordered in UC Medications  ketorolac (TORADOL) injection 60 mg (60 mg Intramuscular Given 07/23/17 1503)     Initial Impression / Assessment and Plan / UC Course  I have reviewed the triage vital signs and the nursing notes.  Pertinent labs & imaging results that were available during my care of the patient were reviewed by me and considered in my medical decision making (see chart for details).    Discussed possible lymphadenopathy causing symptoms. Toradol injection in office today. Mobic as directed for the next few days. Patient states dental pain is intermittent and comes and goes. Written Rx of penicillin given, patient to take if dental pain returns. Follow up with dentist for further evaluation of tooth. Patient currently with full ROM of neck, clear oropharynx, afebrile, in no acute distress. Return precautions given.   Final Clinical Impressions(s) / UC Diagnoses   Final diagnoses:  Pain, dental  Neck pain    ED Discharge Orders        Ordered    penicillin v potassium (VEETID) 500 MG tablet  4 times daily     07/23/17 1448    meloxicam (MOBIC) 15 MG tablet  Daily     07/23/17 1455        Belinda FisherYu, Jaisha Villacres V, [place]PA-C 07/23/17 1529

## 2017-07-23 NOTE — Discharge Instructions (Signed)
No definite infection seen today. Symptoms could be due to lymph node pain. Take ibuprofen 800mg  three times a day to help with pain and inflammation. Written Rx of penicillin given. If symptoms not improving in 2 days, can start penicillin for possible dental abscess. Please also follow up with dentist for evaluation of tooth. If experiencing swelling of the throat, trouble breathing, trouble swallowing, go to the emergency department for further evaluation.

## 2017-08-24 ENCOUNTER — Encounter (HOSPITAL_COMMUNITY): Payer: Self-pay | Admitting: Emergency Medicine

## 2017-08-24 ENCOUNTER — Ambulatory Visit (HOSPITAL_COMMUNITY)
Admission: EM | Admit: 2017-08-24 | Discharge: 2017-08-24 | Disposition: A | Discharge status: Home / Self Care | Payer: 59 | Attending: Family Medicine | Admitting: Family Medicine

## 2017-08-24 ENCOUNTER — Other Ambulatory Visit: Payer: Self-pay

## 2017-08-24 DIAGNOSIS — M79672 Pain in left foot: Secondary | ICD-10-CM

## 2017-08-24 MED ORDER — DICLOFENAC SODIUM 75 MG PO TBEC: 75.0000 mg | DELAYED_RELEASE_TABLET | Freq: Two times a day (BID) | ORAL | 0 refills | Status: DC

## 2017-08-24 NOTE — ED Triage Notes (Signed)
Pt reports a chronic pain in his left heel.  He had an injury to the left leg and foot over a year ago and he states this pain comes up every once in a while.  He was out of work for 7-8 months and went to rehab for the leg and foot.  He is having a lot of sharp pain in his left heel today.

## 2017-08-29 NOTE — ED Provider Notes (Signed)
  [facility]   [id number] 08/24/17 Arrival Time: 1346  ASSESSMENT & PLAN:  1. Foot pain, left     Meds ordered this encounter  Medications  . diclofenac (VOLTAREN) 75 MG EC tablet    Sig: Take 1 tablet (75 mg total) by mouth 2 (two) times daily.    Dispense:  14 tablet    Refill:  0   Will f/u as needed. Question mild Achilles tendinitis.  Reviewed expectations re: course of current medical issues. Questions answered. Outlined signs and symptoms indicating need for more acute intervention. Patient verbalized understanding. After Visit Summary given.   SUBJECTIVE: History from: patient. Donald Stafford is a 41 y.o. male who presents with complaint of intermittent pain in his left foot/heel. Injury approx 7 months ago with similar symptoms. Worse after prolonged standing and walking. Better with rest. OTC analgesics without much relief. No extremity sensation changes or weakness. Ambulatory without difficulty. No new injury or trauma reported.  ROS: As per HPI.   OBJECTIVE:  Vitals:   08/24/17 1438  BP: (!) 160/102  Pulse: 73  Temp: 98.3 F (36.8 C)  TempSrc: Oral  SpO2: 100%    General appearance: alert; no distress Back: no CVA tenderness Extremities: no cyanosis or edema; symmetrical with no gross deformities; slight tenderness around Achilles tendon; no erythema; FROM of L foot/ankle Skin: warm and dry Neurologic: normal gait; normal symmetric reflexes Psychological: alert and cooperative; normal mood and affect   Allergies  Allergen Reactions  . Shellfish Allergy     Past Medical History:  Diagnosis Date  . Hypertension    Social History   Socioeconomic History  . Marital status: Married    Spouse name: Not on file  . Number of children: Not on file  . Years of education: Not on file  . Highest education level: Not on file  Social Needs  . Financial resource strain: Not on file  . Food insecurity - worry: Not on file  . Food  insecurity - inability: Not on file  . Transportation needs - medical: Not on file  . Transportation needs - non-medical: Not on file  Occupational History  . Not on file  Tobacco Use  . Smoking status: Former Smoker    Types: Cigars    Last attempt to quit: 10/23/2015    Years since quitting: 1.8  . Smokeless tobacco: Never Used  . Tobacco comment: black and milds--quit 15 years ago  Substance and Sexual Activity  . Alcohol use: Yes    Comment: occasion  . Drug use: No  . Sexual activity: Not on file  Other Topics Concern  . Not on file  Social History Narrative  . Not on file   Family History  Problem Relation Age of Onset  . Hypertension Mother    Past Surgical History:  Procedure Laterality Date  . arm surgery Right      Mardella LaymanHagler, Pierre Dellarocco, MD 08/29/17 [phone]

## 2017-10-22 ENCOUNTER — Emergency Department (HOSPITAL_BASED_OUTPATIENT_CLINIC_OR_DEPARTMENT_OTHER)
Admit: 2017-10-22 | Discharge: 2017-10-22 | Disposition: A | Discharge status: Home / Self Care | Payer: Self-pay | Attending: Emergency Medicine | Admitting: Emergency Medicine

## 2017-10-22 ENCOUNTER — Encounter: Payer: Self-pay | Admitting: Nurse Practitioner

## 2017-10-22 ENCOUNTER — Emergency Department (HOSPITAL_COMMUNITY)
Admission: EM | Admit: 2017-10-22 | Discharge: 2017-10-22 | Disposition: A | Discharge status: Home / Self Care | Payer: Self-pay | Attending: Emergency Medicine | Admitting: Emergency Medicine

## 2017-10-22 ENCOUNTER — Ambulatory Visit (INDEPENDENT_AMBULATORY_CARE_PROVIDER_SITE_OTHER): Payer: Self-pay | Admitting: Nurse Practitioner

## 2017-10-22 ENCOUNTER — Encounter (HOSPITAL_COMMUNITY): Payer: Self-pay | Admitting: Emergency Medicine

## 2017-10-22 VITALS — BP 130/80 | HR 83 | Temp 97.6°F | Resp 16 | Wt 264.2 lb

## 2017-10-22 DIAGNOSIS — I8001 Phlebitis and thrombophlebitis of superficial vessels of right lower extremity: Secondary | ICD-10-CM | POA: Insufficient documentation

## 2017-10-22 DIAGNOSIS — Z87891 Personal history of nicotine dependence: Secondary | ICD-10-CM | POA: Insufficient documentation

## 2017-10-22 DIAGNOSIS — M79609 Pain in unspecified limb: Secondary | ICD-10-CM

## 2017-10-22 DIAGNOSIS — M79661 Pain in right lower leg: Secondary | ICD-10-CM

## 2017-10-22 DIAGNOSIS — I10 Essential (primary) hypertension: Secondary | ICD-10-CM | POA: Insufficient documentation

## 2017-10-22 DIAGNOSIS — Z79899 Other long term (current) drug therapy: Secondary | ICD-10-CM | POA: Insufficient documentation

## 2017-10-22 DIAGNOSIS — M7989 Other specified soft tissue disorders: Secondary | ICD-10-CM

## 2017-10-22 MED ORDER — IBUPROFEN 800 MG PO TABS: 800.0000 mg | ORAL_TABLET | Freq: Three times a day (TID) | ORAL | 0 refills | Status: AC

## 2017-10-22 MED ORDER — CEPHALEXIN 250 MG PO CAPS
1000.0000 mg | ORAL_CAPSULE | Freq: Once | ORAL | Status: AC
  Administered 2017-10-22: 1000 mg via ORAL
  Filled 2017-10-22: qty 4

## 2017-10-22 MED ORDER — IBUPROFEN 800 MG PO TABS
800.0000 mg | ORAL_TABLET | Freq: Once | ORAL | Status: AC
  Administered 2017-10-22: 800 mg via ORAL
  Filled 2017-10-22: qty 1

## 2017-10-22 MED ORDER — CEPHALEXIN 500 MG PO CAPS: 500.0000 mg | ORAL_CAPSULE | Freq: Four times a day (QID) | ORAL | 0 refills | Status: AC

## 2017-10-22 NOTE — Patient Instructions (Signed)
Deep Vein Thrombosis Deep vein thrombosis (DVT) is a condition in which a blood clot forms in a deep vein, such as a lower leg, thigh, or arm vein. A clot is blood that has thickened into a gel or solid. This condition is dangerous. It can lead to serious and even life-threatening complications if the clot travels to the lungs and causes a blockage (pulmonary embolism). It can also damage veins in the leg. This can result in leg pain, swelling, discoloration, and sores (post-thrombotic syndrome). What are the causes? This condition may be caused by:  A slowdown of blood flow.  Damage to a vein.  A condition that makes blood clot more easily.  What increases the risk? The following factors may make you more likely to develop this condition:  Being overweight.  Being elderly, especially over age 60.  Sitting or lying down for more than four hours.  Lack of physical activity (sedentary lifestyle).  Being pregnant, giving birth, or having recently given birth.  Taking medicines that contain estrogen.  Smoking.  A history of any of the following: ? Blood clots or blood clotting disease. ? Peripheral vascular disease. ? Inflammatory bowel disease. ? Cancer. ? Heart disease. ? Genetic conditions that affect how blood clots. ? Neurological diseases that affect the legs (leg paresis). ? Injury. ? Major or lengthy surgery. ? A central line placed inside a large vein.  What are the signs or symptoms? Symptoms of this condition include:  Swelling, pain, or tenderness in an arm or leg.  Warmth, redness, or discoloration in an arm or leg.  If the clot is in your leg, symptoms may be more noticeable or worse when you stand or walk. Some people do not have any symptoms. How is this diagnosed? This condition is diagnosed with:  A medical history.  A physical exam.  Tests, such as: ? Blood tests. These are done to see how your blood clots. ? Imaging tests. These are done to  check for clots. Tests may include:  Ultrasound.  CT scan.  MRI.  X-ray.  Venogram. For this test, X-rays are taken after a dye is injected into a vein.  How is this treated? Treatment for this condition depends on the cause, your risk for bleeding or developing more clots, and any medical conditions you have. Treatment may include:  Taking blood thinners (also called anticoagulants). These medicines may be taken by mouth, injected under the skin, or injected through an IV tube (catheter). These medicines prevent clots from forming.  Injecting medicine that dissolves blood clots into the affected vein (catheter-directed thrombolysis).  Having surgery. Surgery may be done to: ? Remove the clot. ? Place a filter in a large vein to catch blood clots before they reach the lungs.  Some treatments may be continued for up to six months. Follow these instructions at home: If you are taking an oral blood thinner:  Take the medicine exactly as told by your health care provider. Some blood thinners need to be taken at the same time every day. Do not skip a dose.  Ask your health care provider about what foods and drugs interact with the medicine.  Ask about possible side effects. General instructions  Blood thinners can cause easy bruising and difficulty stopping bleeding. Because of this, if you are taking or were given a blood thinner: ? Hold pressure over cuts for longer than usual. ? Tell your dentist and other health care providers that you are taking blood thinners before   having any procedures that can cause bleeding. ? Avoid contact sports.  Take over-the-counter and prescription medicines only as told by your health care provider.  Return to your normal activities as told by your health care provider. Ask your health care provider what activities are safe for you.  Wear compression stockings if recommended by your health care provider.  Keep all follow-up visits as told by  your health care provider. This is important. How is this prevented? To lower your risk of developing this condition again:  For 30 or more minutes every day, do an activity that: ? Involves moving your arms and legs. ? Increases your heart rate.  When traveling for longer than four hours: ? Exercise your arms and legs every hour. ? Drink plenty of water. ? Avoid drinking alcohol.  Avoid sitting or lying for a long time without moving your legs.  Stay a healthy weight.  If you are a woman who is older than age 35, avoid unnecessary use of medicines that contain estrogen.  Do not use any products that contain nicotine or tobacco, such as cigarettes and e-cigarettes. This is especially important if you take estrogen medicines. If you need help quitting, ask your health care provider.  Contact a health care provider if:  You miss a dose of your blood thinner.  You have nausea, vomiting, or diarrhea that lasts for more than one day.  Your menstrual period is heavier than usual.  You have unusual bruising. Get help right away if:  You have new or increased pain, swelling, or redness in an arm or leg.  You have numbness or tingling in an arm or leg.  You have shortness of breath.  You have chest pain.  You have a rapid or irregular heartbeat.  You feel light-headed or dizzy.  You cough up blood.  There is blood in your vomit, stool, or urine.  You have a serious fall or accident, or you hit your head.  You have a severe headache or confusion.  You have a cut that will not stop bleeding. These symptoms may represent a serious problem that is an emergency. Do not wait to see if the symptoms will go away. Get medical help right away. Call your local emergency services (911 in the [place] Do not drive yourself to the hospital. Summary  DVT is a condition in which a blood clot forms in a deep vein, such as a lower leg, thigh, or arm vein.  Symptoms can include swelling,  warmth, pain, and redness in your leg or arm.  Treatment may include taking blood thinners, injecting medicine that dissolves blood clots,wearing compression stockings, or surgery.  If you are prescribed blood thinners, take them exactly as told. This information is not intended to replace advice given to you by your health care provider. Make sure you discuss any questions you have with your health care provider. Document Released: 08/16/2005 Document Revised: 09/18/2016 Document Reviewed: 09/18/2016 Elsevier Interactive Patient Education  2018 [employer]  

## 2017-10-22 NOTE — ED Triage Notes (Signed)
Pt to ER [facility] for rule out DVT of right lower extremity. States posterior knee has been "tight" for 3 weeks, and for two days swelling has become apparent. +1 RL extremity swelling noted. NAD. States recent drive to [place] last month.

## 2017-10-22 NOTE — Progress Notes (Signed)
   Subjective:    Patient ID: Donald Stafford, male    DOB: 10-15-1975, 42 y.o.   MRN: [id number]  HPI Patient comes in c/o pain behind right knee and has gradually gotten worse. Pain is aching pain. Sitting with knee flexed increases pain. Nothing is helping pain . He has not taken any OTC meds.  Review of Systems  Respiratory: Negative.   Cardiovascular: Negative.   Musculoskeletal: Positive for arthralgias.       Right calf pain   All other systems reviewed and are negative.      Objective:   Physical Exam  Constitutional: He is oriented to person, place, and time. He appears well-developed and well-nourished.  Cardiovascular: Normal rate and regular rhythm.  Pulmonary/Chest: Effort normal and breath sounds normal.  Neurological: He is alert and oriented to person, place, and time.  Skin: Skin is warm.  Psychiatric: He has a normal mood and affect. His behavior is normal. Judgment and thought content normal.   BP 130/80 (BP Location: Right Arm, Patient Position: Sitting, Cuff Size: Large)   Pulse 83   Temp 97.6 F (36.4 C) (Oral)   Resp 16   Wt 264 lb 3.2 oz (119.8 kg)   SpO2 97%   BMI 30.53 kg/m       Assessment & Plan:   1. Right calf pain    Possible DVT or bakers cyst rupture Needs to go to er to have doppler study  Mary-Margaret Daphine DeutscherMartin, FNP

## 2017-10-22 NOTE — ED Notes (Signed)
Pt stable, ambulatory, states understanding of discharge instructions 

## 2017-10-22 NOTE — ED Provider Notes (Signed)
Emergency Department Provider Note   I have reviewed the triage vital signs and the nursing notes.   HISTORY  Chief Complaint calf pain/r/o DVT   HPI Donald Stafford is a 42 y.o. male who presents the emergency department today from urgent care secondary to concerns for DVT.  Patient states that 3 weeks of tightness behind his right knee within the last 24 hours he is also noticed swelling leg.  States he did have a long trip to [place] at the beginning of January but no other surgery or.  No trauma to the knee.  Patient states it feels tight.  Worse with range of motion.  No chest pain or shortness of breath.  No recent fevers, cough or congestion.  No history of blood clots. No other associated or modifying symptoms.    Past Medical History:  Diagnosis Date  . Hypertension     There are no active problems to display for this patient.   Past Surgical History:  Procedure Laterality Date  . arm surgery Right     Current Outpatient Rx  . Order #: [id number] Class: Print  . Order #: [id number] Class: Historical Med  . Order #: [id number] Class: Print  . Order #: [id number] Class: Historical Med    Allergies Shellfish allergy  Family History  Problem Relation Age of Onset  . Hypertension Mother     Social History Social History   Tobacco Use  . Smoking status: Former Smoker    Types: Cigars    Last attempt to quit: 10/23/2015    Years since quitting: 2.0  . Smokeless tobacco: Never Used  . Tobacco comment: black and milds--quit 15 years ago  Substance Use Topics  . Alcohol use: Yes    Comment: occasion  . Drug use: No    Review of Systems  All other systems negative except as documented in the HPI. All pertinent positives and negatives as reviewed in the HPI. ____________________________________________   PHYSICAL EXAM:  VITAL SIGNS: ED Triage Vitals  Enc Vitals Group     BP 10/22/17 1712 133/76     Pulse Rate 10/22/17 1712 73     Resp 10/22/17 1712  18     Temp 10/22/17 1712 98.4 F (36.9 C)     Temp Source 10/22/17 1712 Oral     SpO2 10/22/17 1712 99 %     Weight --      Height --      Head Circumference --      Peak Flow --      Pain Score 10/22/17 1711 4     Pain Loc --      Pain Edu? --      Excl. in GC? --     Constitutional: Alert and oriented. Well appearing and in no acute distress. Eyes: Conjunctivae are normal. PERRL. EOMI. Head: Atraumatic. Nose: No congestion/rhinnorhea. Mouth/Throat: Mucous membranes are moist.  Oropharynx non-erythematous. Neck: No stridor.  No meningeal signs.   Cardiovascular: Normal rate, regular rhythm. Good peripheral circulation. Grossly normal heart sounds.   Respiratory: Normal respiratory effort.  No retractions. Lungs CTAB. Gastrointestinal: Soft and nontender. No distention.  Musculoskeletal: No lower extremity tenderness nor edema. No gross deformities of extremities. Neurologic:  Normal speech and language. No gross focal neurologic deficits are appreciated.  Skin:  Skin is warm, dry and intact. No rash noted.  ____________________________________________   LABS (all labs ordered are listed, but only abnormal results are displayed)  Labs Reviewed - No data to display ____________________________________________  RADIOLOGY  No results found.  ____________________________________________   PROCEDURES  Procedure(s) performed:   Procedures   ____________________________________________   INITIAL IMPRESSION / ASSESSMENT AND PLAN / ED COURSE  Superficial thrombophlebitis. Will treat with abx/NSAIDs/warm compresses. PCP follow up. No e/o DVT.      Pertinent labs & imaging results that were available during my care of the patient were reviewed by me and considered in my medical decision making (see chart for details).  ____________________________________________  FINAL CLINICAL IMPRESSION(S) / ED DIAGNOSES  Final diagnoses:  Thrombophlebitis of superficial  veins of right lower extremity     MEDICATIONS GIVEN DURING THIS VISIT:  Medications  cephALEXin (KEFLEX) capsule 1,000 mg (not administered)  ibuprofen (ADVIL,MOTRIN) tablet 800 mg (not administered)     NEW OUTPATIENT MEDICATIONS STARTED DURING THIS VISIT:  New Prescriptions   CEPHALEXIN (KEFLEX) 500 MG CAPSULE    Take 1 capsule (500 mg total) by mouth 4 (four) times daily.   IBUPROFEN (ADVIL,MOTRIN) 800 MG TABLET    Take 1 tablet (800 mg total) by mouth 3 (three) times daily.    Note:  This note was prepared with assistance of Dragon voice recognition software. Occasional wrong-word or sound-a-like substitutions may have occurred due to the inherent limitations of voice recognition software.   Marily Memos, MD 10/22/17 1929

## 2017-10-22 NOTE — Progress Notes (Signed)
VASCULAR LAB PRELIMINARY  PRELIMINARY  PRELIMINARY  PRELIMINARY  Right lower extremity venous duplex completed.    Preliminary report:  There is no DVT noted in the right lower extremity.  There is superficial thrombophlebitis noted in the small saphenous vein in the mid calf to popliteal fossa and in the greater saphenous in the popliteal fossa and distal thigh.  Lucyann Romano, RVT 10/22/2017, 6:25 PM

## 2017-10-26 ENCOUNTER — Telehealth: Payer: Self-pay

## 2017-10-26 NOTE — Telephone Encounter (Signed)
Called to check on pt and pt states he ended up having to go to the ER but he is doing better now.

## 2020-03-28 ENCOUNTER — Ambulatory Visit: Payer: HRSA Program | Attending: Internal Medicine

## 2020-03-28 DIAGNOSIS — Z20822 Contact with and (suspected) exposure to covid-19: Secondary | ICD-10-CM | POA: Insufficient documentation

## 2020-03-29 ENCOUNTER — Telehealth: Payer: Self-pay

## 2020-03-29 LAB — NOVEL CORONAVIRUS, NAA: SARS-CoV-2, NAA: NOT DETECTED

## 2020-03-29 LAB — SARS-COV-2, NAA 2 DAY TAT

## 2020-03-29 NOTE — Telephone Encounter (Signed)
Called and informed patient that test for Covid 19 was NEGATIVE. Discussed signs and symptoms of Covid 19 : fever, chills, respiratory symptoms, cough, ENT symptoms, sore throat, SOB, muscle pain, diarrhea, headache, loss of taste/smell, close exposure to COVID-19 patient. Pt instructed to call PCP if they develop the above signs and sx. Pt also instructed to call 911 if having respiratory issues/distress. Discussed MyChart enrollment. Pt verbalized understanding.Spoke with pt's wife. 

## 2020-09-04 ENCOUNTER — Other Ambulatory Visit: Payer: Self-pay

## 2021-11-24 ENCOUNTER — Emergency Department (HOSPITAL_COMMUNITY): Payer: 59

## 2021-11-24 ENCOUNTER — Encounter (HOSPITAL_COMMUNITY): Payer: Self-pay

## 2021-11-24 ENCOUNTER — Other Ambulatory Visit: Payer: Self-pay

## 2021-11-24 ENCOUNTER — Emergency Department (HOSPITAL_COMMUNITY)
Admission: EM | Admit: 2021-11-24 | Discharge: 2021-11-24 | Disposition: A | Discharge status: Home / Self Care | Payer: 59 | Attending: Emergency Medicine | Admitting: Emergency Medicine

## 2021-11-24 DIAGNOSIS — J3489 Other specified disorders of nose and nasal sinuses: Secondary | ICD-10-CM | POA: Insufficient documentation

## 2021-11-24 DIAGNOSIS — R519 Headache, unspecified: Secondary | ICD-10-CM | POA: Insufficient documentation

## 2021-11-24 MED ORDER — DIPHENHYDRAMINE HCL 25 MG PO CAPS
25.0000 mg | ORAL_CAPSULE | Freq: Once | ORAL | Status: AC
  Administered 2021-11-24: 25 mg via ORAL
  Filled 2021-11-24: qty 1

## 2021-11-24 MED ORDER — PREDNISONE 20 MG PO TABS: 40.0000 mg | ORAL_TABLET | Freq: Every day | ORAL | 0 refills | Status: AC

## 2021-11-24 MED ORDER — KETOROLAC TROMETHAMINE 15 MG/ML IJ SOLN: 15.0000 mg | Freq: Once | INTRAMUSCULAR | Status: DC

## 2021-11-24 MED ORDER — PROCHLORPERAZINE MALEATE 10 MG PO TABS
10.0000 mg | ORAL_TABLET | Freq: Once | ORAL | Status: AC
  Administered 2021-11-24: 10 mg via ORAL
  Filled 2021-11-24: qty 1

## 2021-11-24 NOTE — ED Provider Notes (Signed)
?[name] ?Provider Note ? ? ?CSN: [id number] ?Arrival date & time: 11/24/21  1227 ? ?  ? ?History ? ?Chief Complaint  ?Patient presents with  ? Headache  ? Nasal Congestion  ? ? ?Donald Stafford is a 46 y.o. male. ? ?46 y.o male with no PMH presents to the ED with a chief complaint of head pressure x 2 days. He reports blowing his nose two days ago when he felt a popping sensation to the left maxillary region. He states the pain has worsen as the days have progressed. His significant other at the bedside has been giving him tylenol without any improvement in symptoms. He also endorses some photophobia. No nausea, no vomiting or other complaints ? ?The history is provided by the patient and medical records.  ?Headache ?Associated symptoms: no abdominal pain, no fever, no nausea and no vomiting   ? ?  ? ?Home Medications ?Prior to Admission medications   ?Medication Sig Start Date End Date Taking? Authorizing Provider  ?predniSONE (DELTASONE) 20 MG tablet Take 2 tablets (40 mg total) by mouth daily for 5 days. 11/24/21 11/29/21 Yes [name], PA-C  ?cephALEXin (KEFLEX) 500 MG capsule Take 1 capsule (500 mg total) by mouth 4 (four) times daily. 10/22/17   Mesner, [name], MD  ?hydrochlorothiazide (HYDRODIURIL) 12.5 MG tablet TK 1 T PO QD 03/24/16   [provider]  ?ibuprofen (ADVIL,MOTRIN) 800 MG tablet Take 1 tablet (800 mg total) by mouth 3 (three) times daily. 10/22/17   Mesner, [name], MD  ?losartan (COZAAR) 50 MG tablet TK 1 T PO QD 07/11/16   [provider]  ?   ? ?Allergies    ?Shellfish allergy   ? ?Review of Systems   ?Review of Systems  ?Constitutional:  Negative for chills and fever.  ?Respiratory:  Negative for shortness of breath.   ?Cardiovascular:  Negative for chest pain.  ?Gastrointestinal:  Negative for abdominal pain, nausea and vomiting.  ?Genitourinary:  Negative for flank pain.  ?Neurological:  Positive for headaches.  ?All other systems  reviewed and are negative. ? ?Physical Exam ?Updated Vital Signs ?BP (!) 158/115 (BP Location: Left Arm)   Pulse 86   Temp 98.5 ?F (36.9 ?C) (Oral)   Resp 17   Ht 6\' 6"  (1.981 m)   Wt 124.7 kg   SpO2 99%   BMI 31.78 kg/m?  ?Physical Exam ?Vitals and nursing note reviewed.  ?Constitutional:   ?   Appearance: He is well-developed. He is not ill-appearing.  ?HENT:  ?   Head: Normocephalic and atraumatic.  ?   Nose:  ?   Left Turbinates: Enlarged.  ?   Right Sinus: No maxillary sinus tenderness or frontal sinus tenderness.  ?   Left Sinus: Maxillary sinus tenderness present. No frontal sinus tenderness.  ?Cardiovascular:  ?   Rate and Rhythm: Normal rate.  ?Pulmonary:  ?   Effort: Pulmonary effort is normal.  ?Abdominal:  ?   Palpations: Abdomen is soft.  ?Skin: ?   General: Skin is warm and dry.  ?Neurological:  ?   Mental Status: He is alert and oriented to person, place, and time.  ?   GCS: GCS eye subscore is 4. GCS verbal subscore is 5. GCS motor subscore is 6.  ? ? ?ED Results / Procedures / Treatments   ?Labs ?(all labs ordered are listed, but only abnormal results are displayed) ?Labs Reviewed - No data to display ? ?EKG ?None ? ?Radiology ?CT HEAD  WO CONTRAST (5MM) ? ?Result Date: 11/24/2021 ?CLINICAL DATA:  Headaches, left facial pain EXAM: CT HEAD WITHOUT CONTRAST TECHNIQUE: Contiguous axial images were obtained from the base of the skull through the vertex without intravenous contrast. RADIATION DOSE REDUCTION: This exam was performed according to the departmental dose-optimization program which includes automated exposure control, adjustment of the mA and/or kV according to patient size and/or use of iterative reconstruction technique. COMPARISON:  None. FINDINGS: Brain: No acute intracranial findings are seen. Ventricles are not dilated. There is no shift of midline structures. There are no epidural or subdural fluid collections. There is no focal mass effect. Vascular: Unremarkable. Skull:  Unremarkable. Sinuses/Orbits: There is mucosal thickening in the ethmoid sinus. There is mild mucosal thickening in the frontal sinus. Maxillary sinuses are not included in their entirety. Other: None IMPRESSION: No acute intracranial findings are seen in noncontrast CT brain. Chronic sinusitis. Electronically Signed   By: [name]  [name]   On: 11/24/2021 17:06   ? ?Procedures ?Procedures  ? ? ?Medications Ordered in ED ?Medications  ?ketorolac (TORADOL) 15 MG/ML injection 15 mg (has no administration in time range)  ?diphenhydrAMINE (BENADRYL) capsule 25 mg (25 mg Oral Given 11/24/21 1636)  ?prochlorperazine (COMPAZINE) tablet 10 mg (10 mg Oral Given 11/24/21 1636)  ? ? ?ED Course/ Medical Decision Making/ A&P ?  ?                        ?Medical Decision Making ?Amount and/or Complexity of Data Reviewed ?Radiology: ordered. ? ?Risk ?Prescription drug management. ? ? ?Patient with no PMH presents to the ED with a chief complaint of headache x 2 days ago. Described as constant pressure along the left maxillary area after blowing his nose. No improvement with OTC meds. He denies any trauma, no prior hx of bone fracture. No other systemic signs.  ? ?On evaluation, there is TTP along the left maxillary area, no bruising or swelling note. FULL EOM with no changes in vision. No visible rash noted. Full Neuro exam is within normal limits. No prior hx of aneurysm, no prior hx of migraines. This headache is accompanied by photophobia.   ? ?I do feel that "pop sensation" he felt two days ago will need further evaluation. Given headache cocktail along with CT Head to further evaluated.  ? ?CT Head with no acute findings aside from chronic sinusitis. Seeing as symptoms have not been present for over 2 weeks, I do not feel he needs antibiotics at this time. We did discussed short  course of steroids to help with symptoms.  He was provided with Benadryl, Compazine. ? ?Patient with improvement in his headache, CT is  negative we discussed the findings.  I did discuss with him that if they wish to continue taking their antibiotics they can do so at their risk.  Did discuss with him a short course of steroids to help with the swelling.  I do suspect likely headache originating from sinusitis.  No changes in vision, no nausea, no tingling in the arms or component to suggest otherwise.  I do feel that patient is stable for discharge. ? ?.Portions of this note were generated with Scientist, clinical (histocompatibility and immunogenetics)Dragon dictation software. Dictation errors may occur despite best attempts at proofreading.   ?Final Clinical Impression(s) / ED Diagnoses ?Final diagnoses:  ?Bad headache  ? ? ?Rx / DC Orders ?ED Discharge Orders   ? ?      Ordered  ?  predniSONE (DELTASONE) 20 MG  tablet  Daily       ? 11/24/21 1748  ? ?  ?  ? ?  ? ? ?  ?[name], PA-C ?11/24/21 1751 ? ?  ?Terrilee Files, MD ?11/25/21 1411 ? ?

## 2021-11-24 NOTE — ED Triage Notes (Signed)
Patient c/o left side pain in his face including left eye, face. Patient states nasal congestion also. Symptoms started yesterday. ?Patient denies blurred vision or nausea. ?

## 2021-11-24 NOTE — Discharge Instructions (Addendum)
You received medication on today's visit to help with your likely headache. ? ?We discussed the results of your CT head which were within normal limits. ? ?You will be started on a short course of steroids in order to help with decreasing in swelling on your sinuses.  If you wish to complete the course of antibiotics that you started may do so your own. ? ?Up with your primary care physician as needed.  If you experience any nausea, vomiting, worsening pain or symptoms you will need to return to the emergency department. ?

## 2022-09-11 IMAGING — CT CT HEAD W/O CM
4 series · 16 of 47 positions shown, 18 images · non-contrast
Comparison: None.

CLINICAL DATA: Headaches, left facial pain



[Series 2: head wo · axial · 0.44mm/px · z∈[-151,-36]mm · 7 of 31 slices shown, 9 images]
[im 4/31  brain]
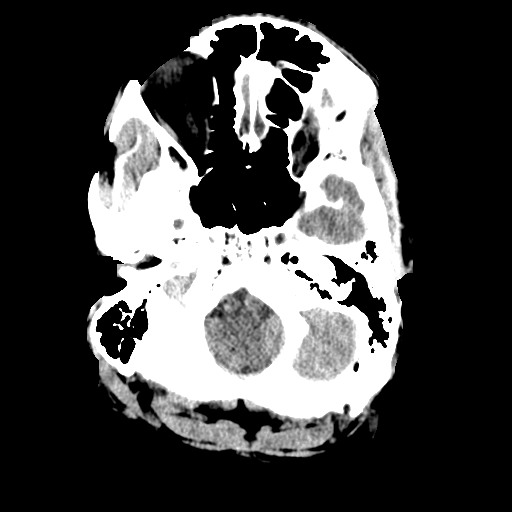
[im 4/31  bone]
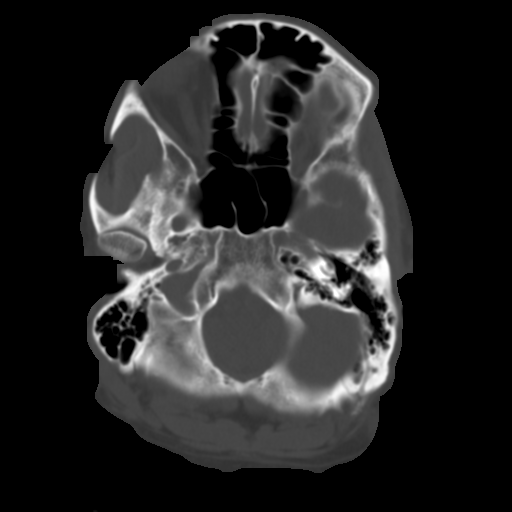
[im 8/31  brain]
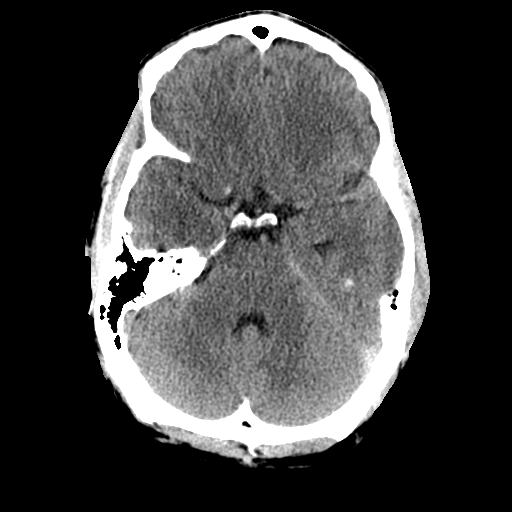
[im 12/31  brain]
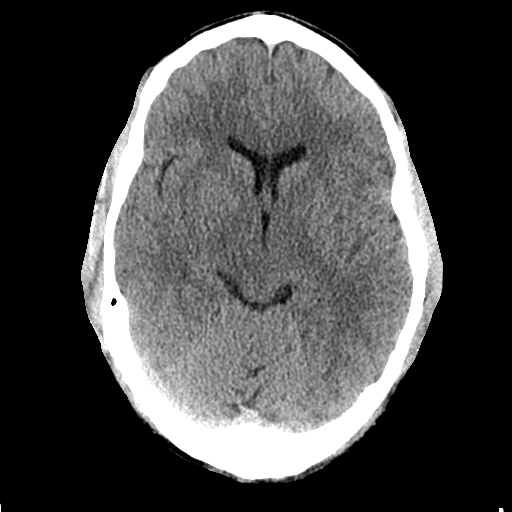
[im 16/31  brain]
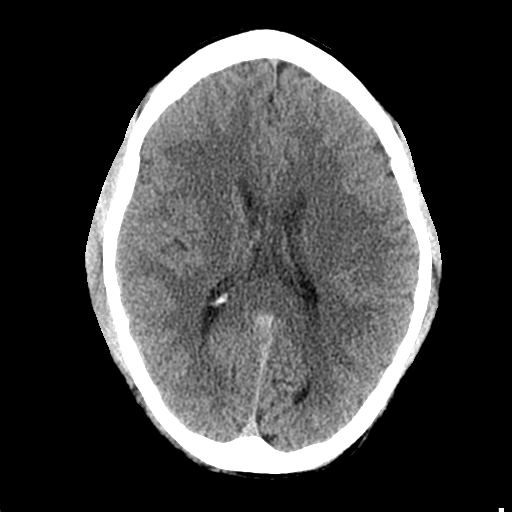
[im 19/31  brain]
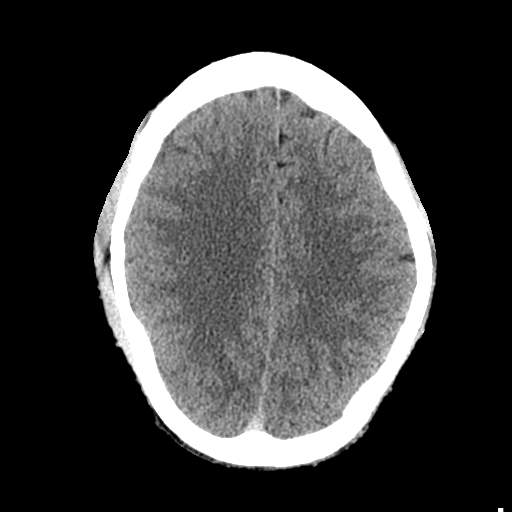
[im 19/31  bone]
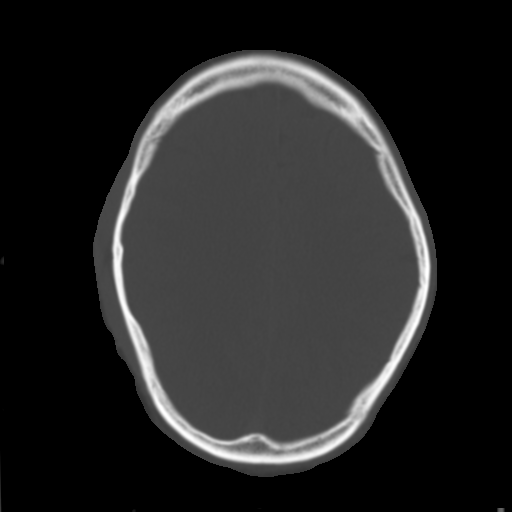
[im 23/31  brain]
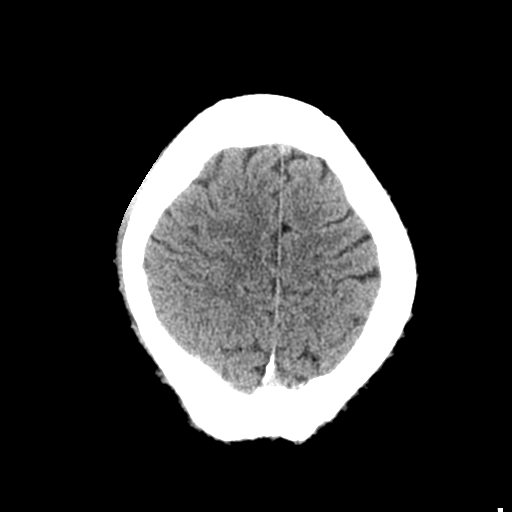
[im 27/31  brain]
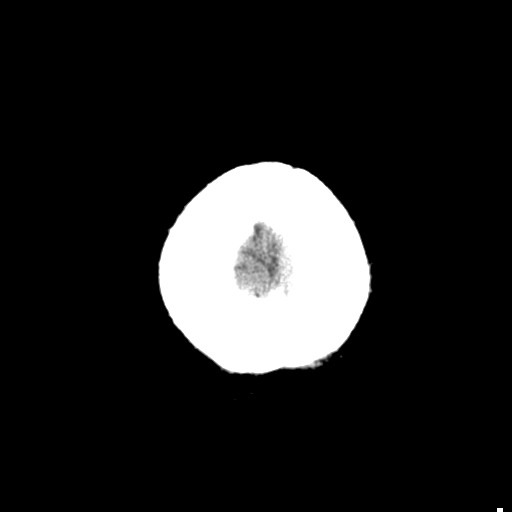

[Series 3: head bone · axial · 0.44mm/px · z∈[-152,-122]mm · 3 of 77 slices shown]
[im 8/77  bone]
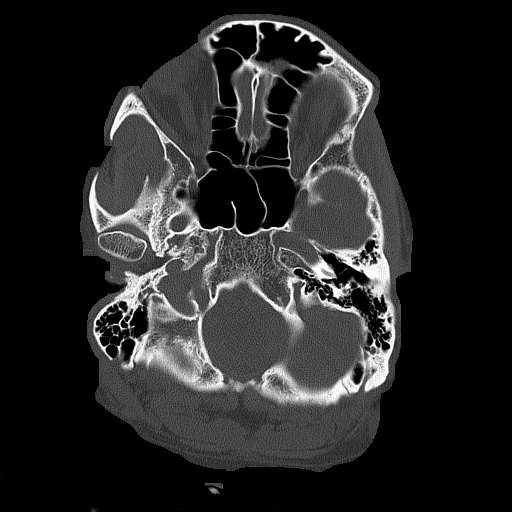
[im 16/77  bone]
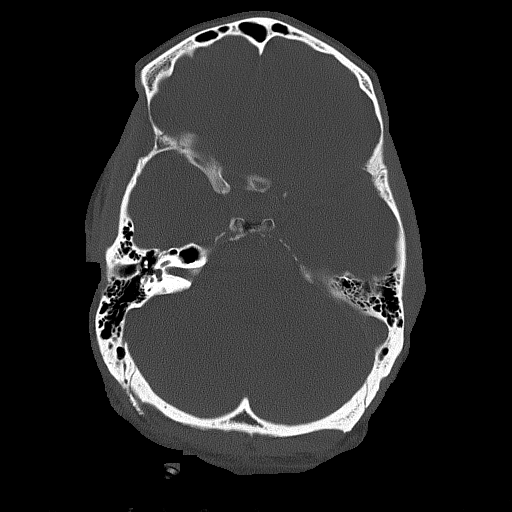
[im 23/77  bone]
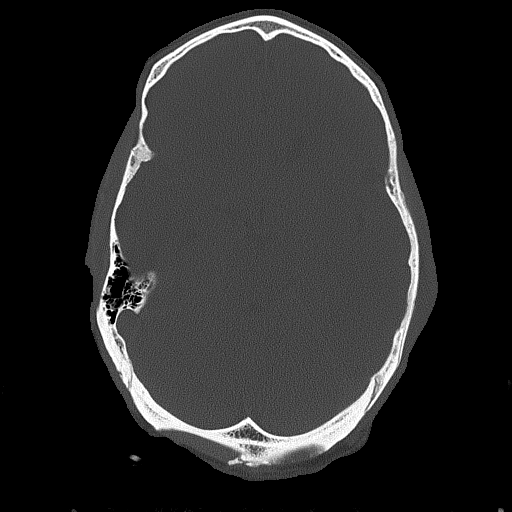

[Series 5: coronal soft tissue · coronal · 0.30mm/px · 3 of 75 slices shown]
[im 25/75  brain]
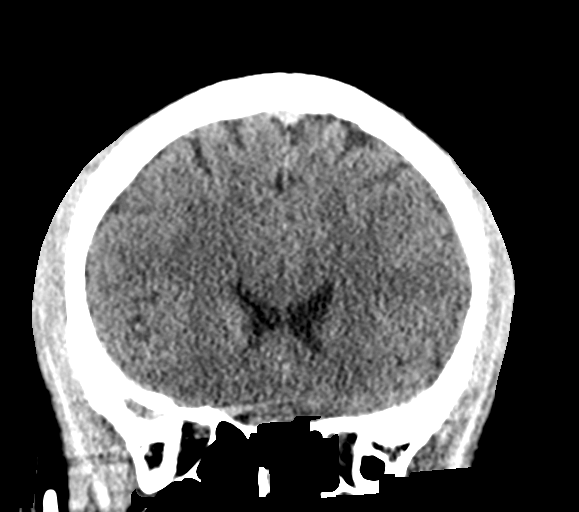
[im 33/75  brain]
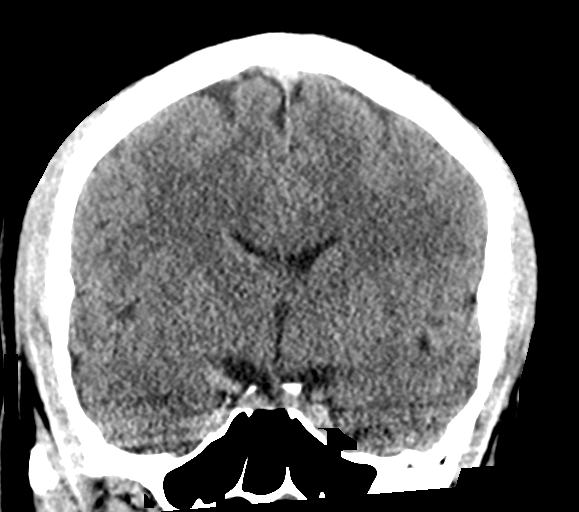
[im 42/75  brain]
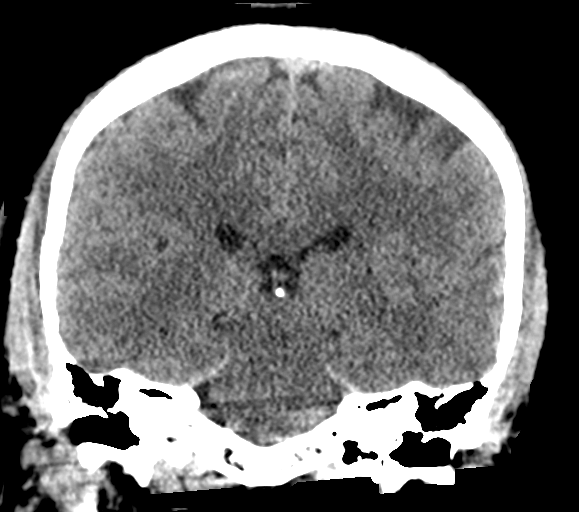

[Series 6: sagittal soft tissue · sagittal · 0.30mm/px · 3 of 58 slices shown]
[im 20/58  brain]
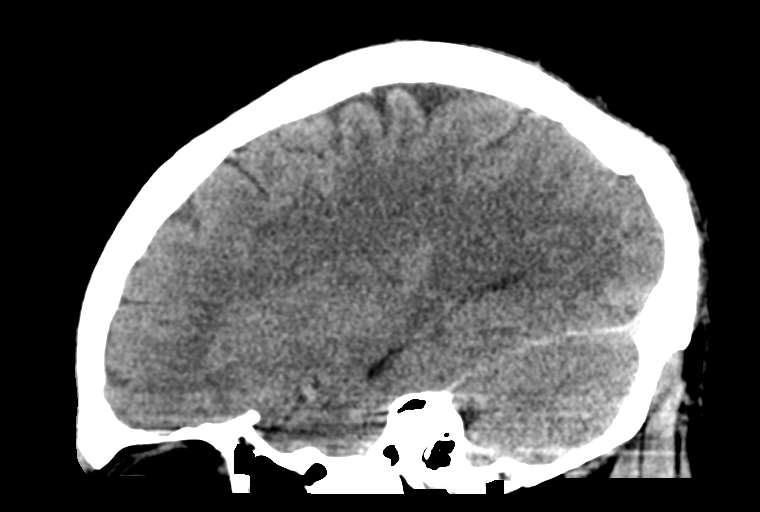
[im 29/58  brain]
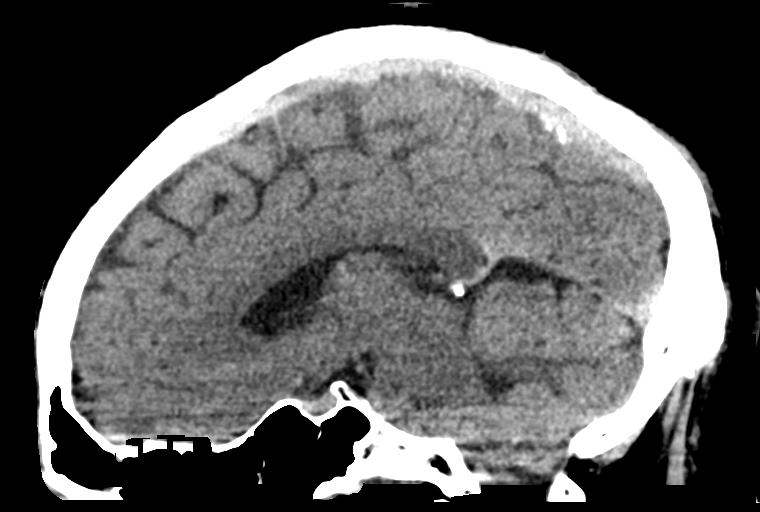
[im 39/58  brain]
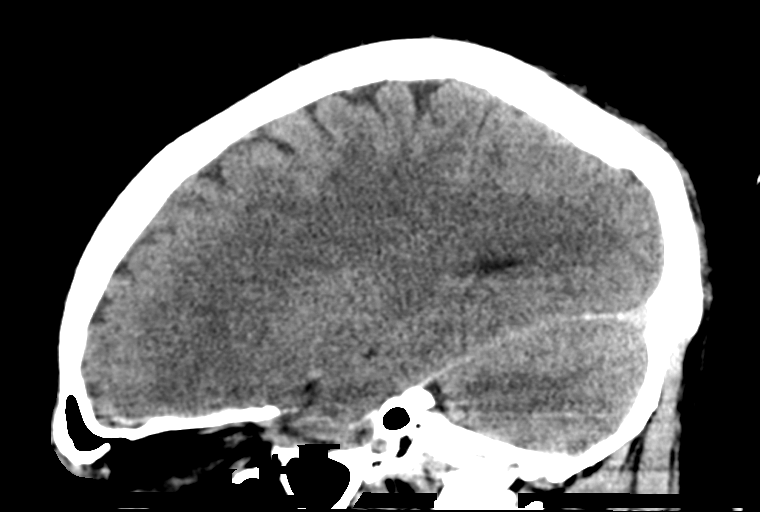

[16 of 47 positions shown; findings below may reference images not displayed]

FINDINGS: Brain: No acute intracranial findings are seen. Ventricles are not
dilated. There is no shift of midline structures. There are no
epidural or subdural fluid collections. There is no focal mass
effect.

Vascular: Unremarkable.

Skull: Unremarkable.

Sinuses/Orbits: There is mucosal thickening in the ethmoid sinus.
There is mild mucosal thickening in the frontal sinus. Maxillary
sinuses are not included in their entirety.

Other: None
IMPRESSION: No acute intracranial findings are seen in noncontrast CT brain.

Chronic sinusitis.

## 2022-12-03 ENCOUNTER — Other Ambulatory Visit: Payer: Self-pay | Admitting: Family Medicine

## 2022-12-03 ENCOUNTER — Ambulatory Visit: Payer: Self-pay

## 2022-12-03 DIAGNOSIS — Z0000 Encounter for general adult medical examination without abnormal findings: Secondary | ICD-10-CM
